# Patient Record
Sex: Male | Born: 1955 | Race: White | Hispanic: No | Smoking: Current every day smoker
Health system: Southern US, Community
[De-identification: ages and names within clinical notes are randomized; demographics above are authoritative.]

---

## 2007-03-10 ENCOUNTER — Ambulatory Visit (HOSPITAL_COMMUNITY): Admission: RE | Admit: 2007-03-10 | Discharge: 2007-03-10 | Payer: Self-pay | Admitting: Family Medicine

## 2007-12-20 ENCOUNTER — Ambulatory Visit (HOSPITAL_COMMUNITY): Admission: RE | Admit: 2007-12-20 | Discharge: 2007-12-20 | Payer: Self-pay | Admitting: Family Medicine

## 2009-04-29 IMAGING — US US EXTREM LOW VENOUS*L*
1 series · 14 of 24 positions shown · non-contrast
Comparison: None available.

CLINICAL DATA: Left leg pain and swelling.



[Series 1: unknown · 14 of 34 slices shown]
[im 1/34]
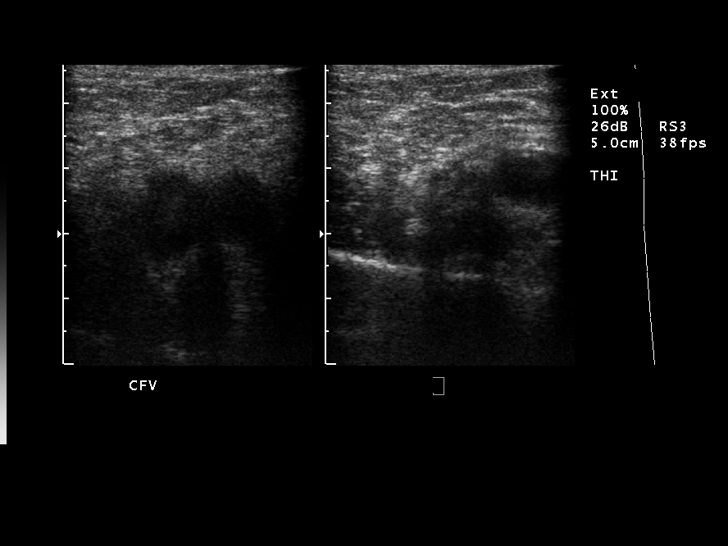
[im 3/34]
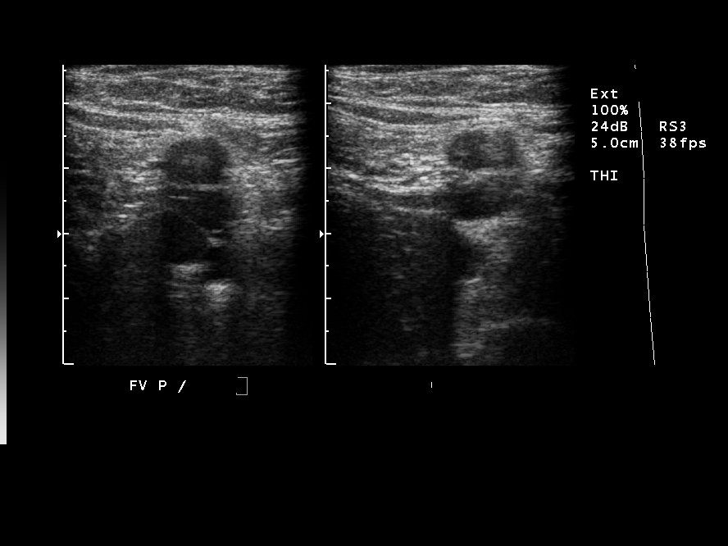
[im 6/34]
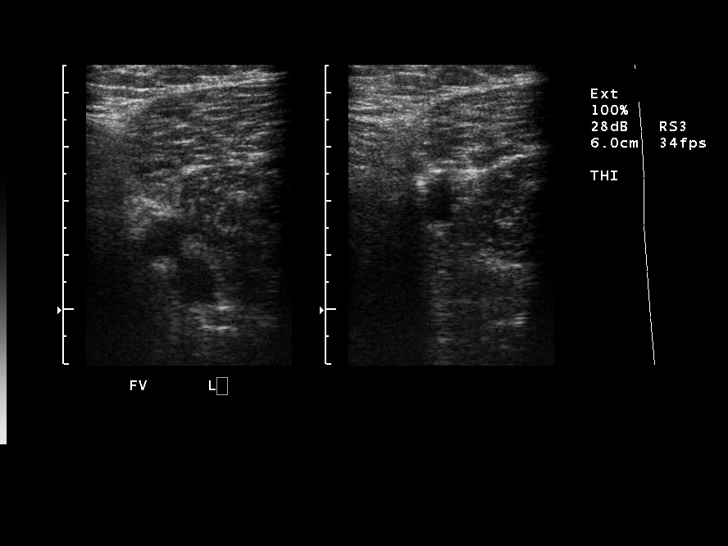
[im 9/34]
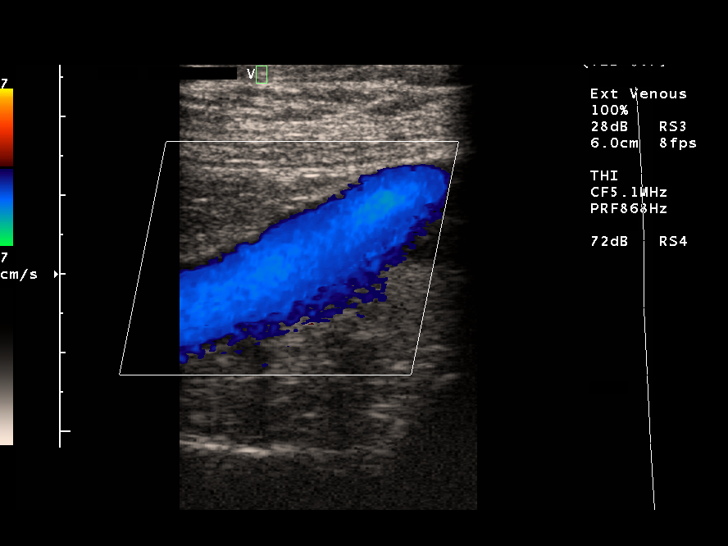
[im 11/34]
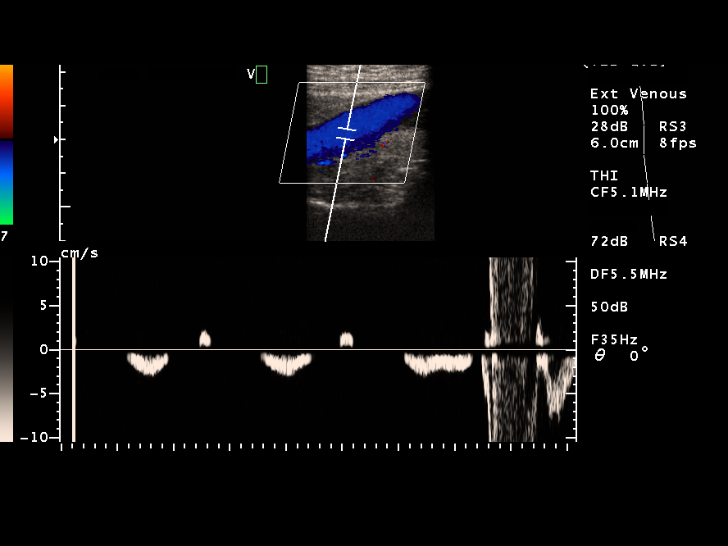
[im 13/34]
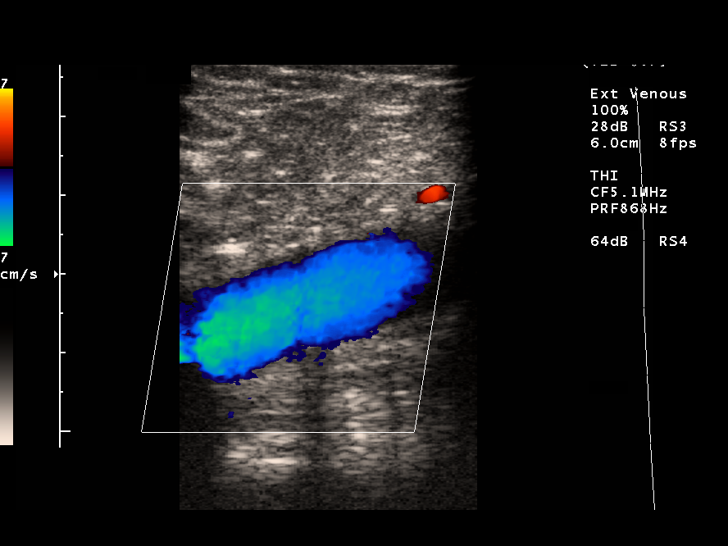
[im 16/34]
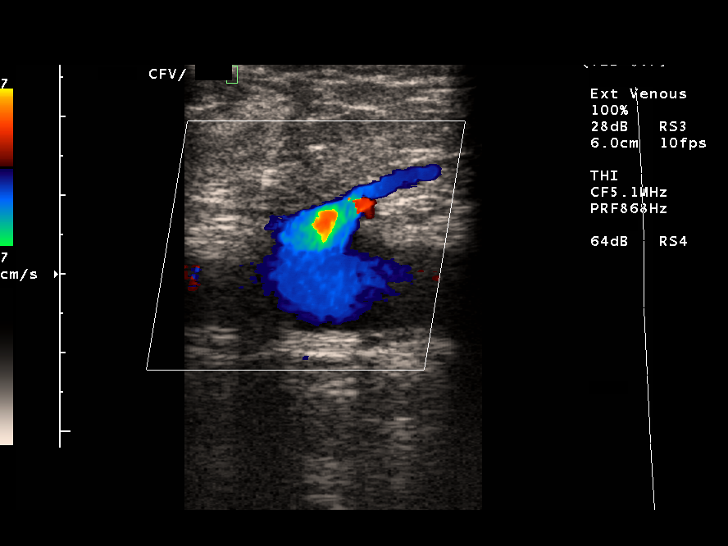
[im 18/34]
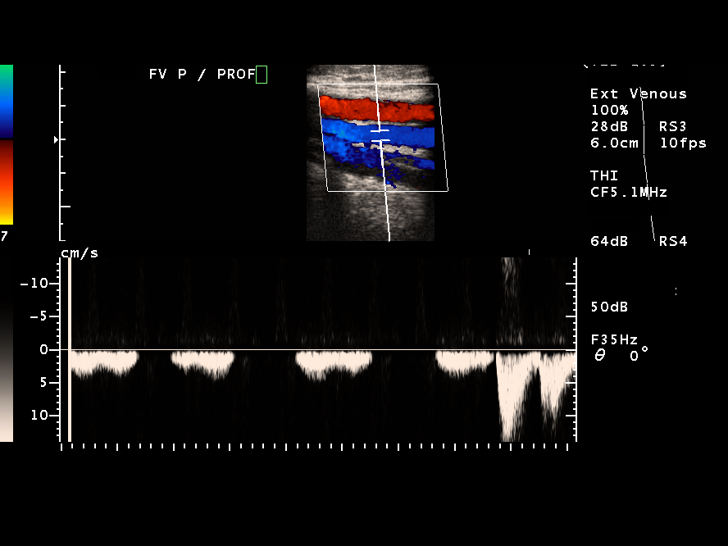
[im 21/34]
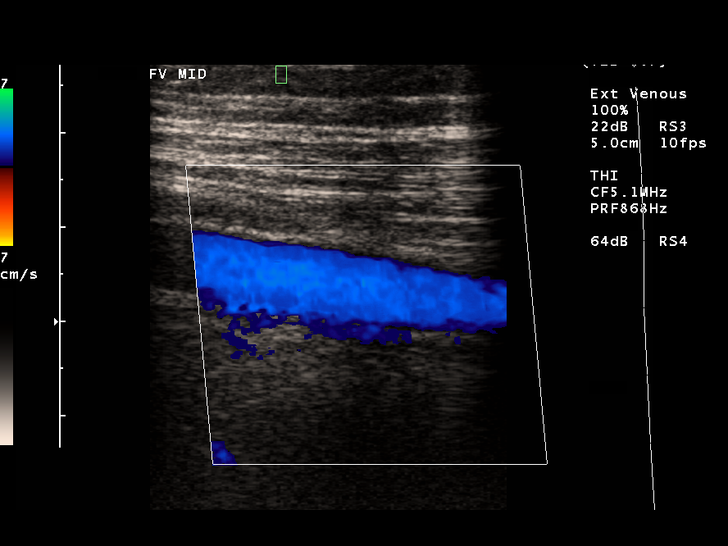
[im 23/34]
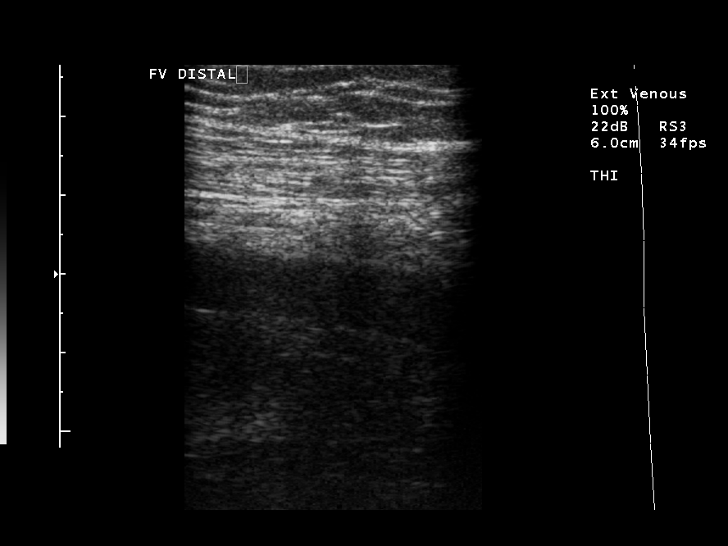
[im 26/34]
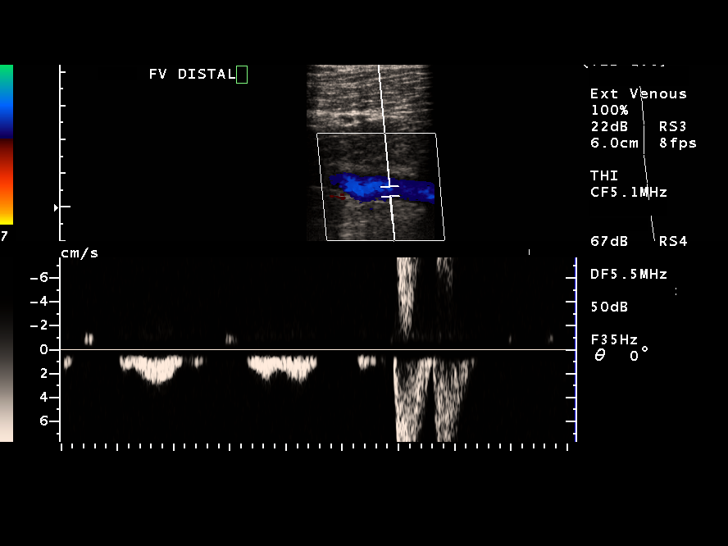
[im 28/34]
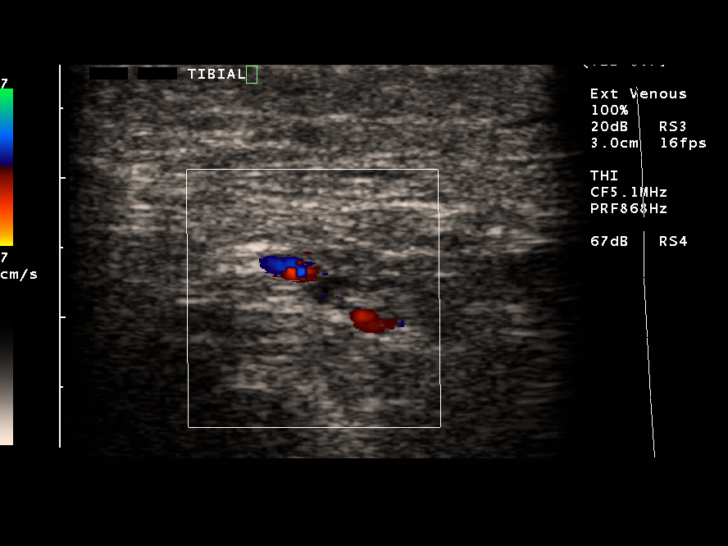
[im 31/34]
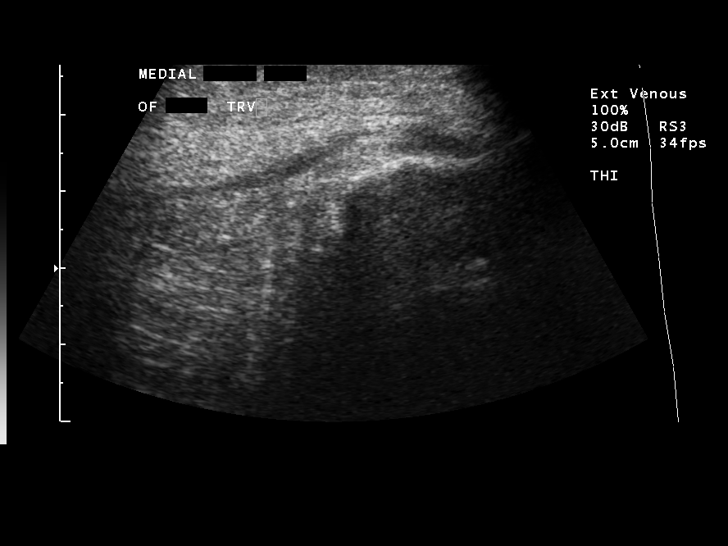
[im 34/34]
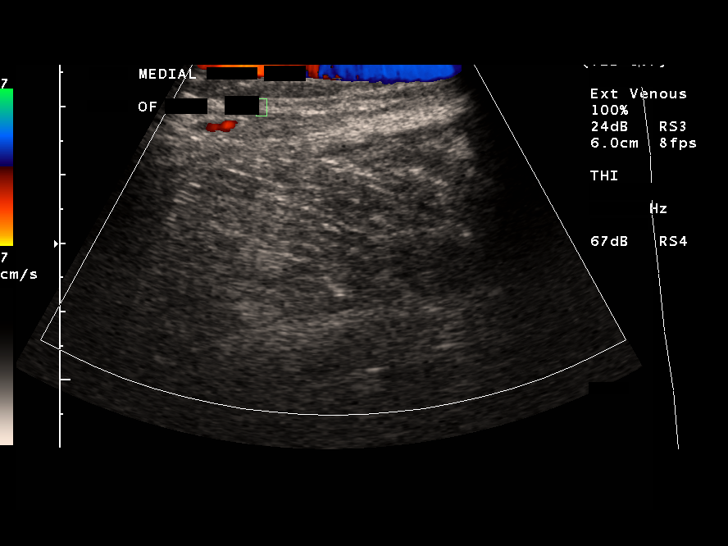

[14 of 24 positions shown; findings below may reference images not displayed]

FINDINGS: There is normal compressibility, phasicity, and
augmentation within the common femoral vein, profundus femoral
vein, and superficial femoral vein.  The popliteal vein is within
normal limits.  There is no evidence for deep venous thrombosis in
the left lower extremity.
IMPRESSION: No evidence for deep venous thrombosis of the left lower extremity.

## 2009-08-06 ENCOUNTER — Inpatient Hospital Stay (HOSPITAL_COMMUNITY): Admission: RE | Admit: 2009-08-06 | Discharge: 2009-08-07 | Payer: Self-pay | Admitting: Urology

## 2009-08-06 ENCOUNTER — Encounter (INDEPENDENT_AMBULATORY_CARE_PROVIDER_SITE_OTHER): Payer: Self-pay | Admitting: Urology

## 2009-08-09 ENCOUNTER — Emergency Department (HOSPITAL_COMMUNITY): Admission: EM | Admit: 2009-08-09 | Discharge: 2009-08-09 | Payer: Self-pay | Admitting: Emergency Medicine

## 2010-04-26 LAB — CBC
HCT: 44.9 % (ref 39.0–52.0)
HCT: 49.3 % (ref 39.0–52.0)
Hemoglobin: 15.7 g/dL (ref 13.0–17.0)
Hemoglobin: 16.6 g/dL (ref 13.0–17.0)
MCH: 33.5 pg (ref 26.0–34.0)
MCHC: 34.9 g/dL (ref 30.0–36.0)
MCHC: 35.2 g/dL (ref 30.0–36.0)
MCV: 95.4 fL (ref 78.0–100.0)
MCV: 96.4 fL (ref 78.0–100.0)
Platelets: 258 10*3/uL (ref 150–400)
RBC: 4.64 MIL/uL (ref 4.22–5.81)
RBC: 5.12 MIL/uL (ref 4.22–5.81)
RDW: 13.5 % (ref 11.5–15.5)
RDW: 13.6 % (ref 11.5–15.5)
WBC: 12.8 10*3/uL — ABNORMAL HIGH (ref 4.0–10.5)

## 2010-04-26 LAB — DIFFERENTIAL
Basophils Absolute: 0 10*3/uL (ref 0.0–0.1)
Lymphs Abs: 0.6 10*3/uL — ABNORMAL LOW (ref 0.7–4.0)
Monocytes Absolute: 0.3 10*3/uL (ref 0.1–1.0)
Monocytes Absolute: 1.1 10*3/uL — ABNORMAL HIGH (ref 0.1–1.0)
Monocytes Relative: 2 % — ABNORMAL LOW (ref 3–12)
Neutro Abs: 10.4 10*3/uL — ABNORMAL HIGH (ref 1.7–7.7)
Neutrophils Relative %: 81 % — ABNORMAL HIGH (ref 43–77)
Neutrophils Relative %: 94 % — ABNORMAL HIGH (ref 43–77)

## 2010-04-26 LAB — URINALYSIS, ROUTINE W REFLEX MICROSCOPIC
Glucose, UA: NEGATIVE mg/dL
Ketones, ur: NEGATIVE mg/dL
Nitrite: NEGATIVE
Specific Gravity, Urine: 1.024 (ref 1.005–1.030)
pH: 5 (ref 5.0–8.0)

## 2010-04-26 LAB — BASIC METABOLIC PANEL
BUN: 10 mg/dL (ref 6–23)
CO2: 24 mEq/L (ref 19–32)
Chloride: 108 mEq/L (ref 96–112)
Creatinine, Ser: 1.11 mg/dL (ref 0.4–1.5)
GFR calc Af Amer: >60 mL/min (ref >60)
GFR calc non Af Amer: >60 mL/min (ref >60)
Glucose, Bld: 114 mg/dL — ABNORMAL HIGH (ref 70–99)
Glucose, Bld: 132 mg/dL — ABNORMAL HIGH (ref 70–99)

## 2010-04-26 LAB — SURGICAL PCR SCREEN
MRSA, PCR: NEGATIVE
Staphylococcus aureus: NEGATIVE

## 2010-04-26 LAB — COMPREHENSIVE METABOLIC PANEL
Albumin: 4.2 g/dL (ref 3.5–5.2)
BUN: 14 mg/dL (ref 6–23)
Chloride: 107 mEq/L (ref 96–112)
GFR calc Af Amer: >60 mL/min (ref >60)
GFR calc non Af Amer: >60 mL/min (ref >60)
Glucose, Bld: 125 mg/dL — ABNORMAL HIGH (ref 70–99)
Potassium: 3.6 mEq/L (ref 3.5–5.1)
Total Bilirubin: 0.8 mg/dL (ref 0.3–1.2)
Total Protein: 7.1 g/dL (ref 6.0–8.3)

## 2010-04-26 LAB — URINE MICROSCOPIC-ADD ON

## 2010-04-26 LAB — TYPE AND SCREEN

## 2010-12-08 IMAGING — CR DG CHEST 2V
2 series · 2 of 2 positions shown · non-contrast
Comparison: Chest x-ray of 03/10/2007

CLINICAL DATA: Preop for prostate carcinoma

CHEST - 2 VIEW

[w chest pa]
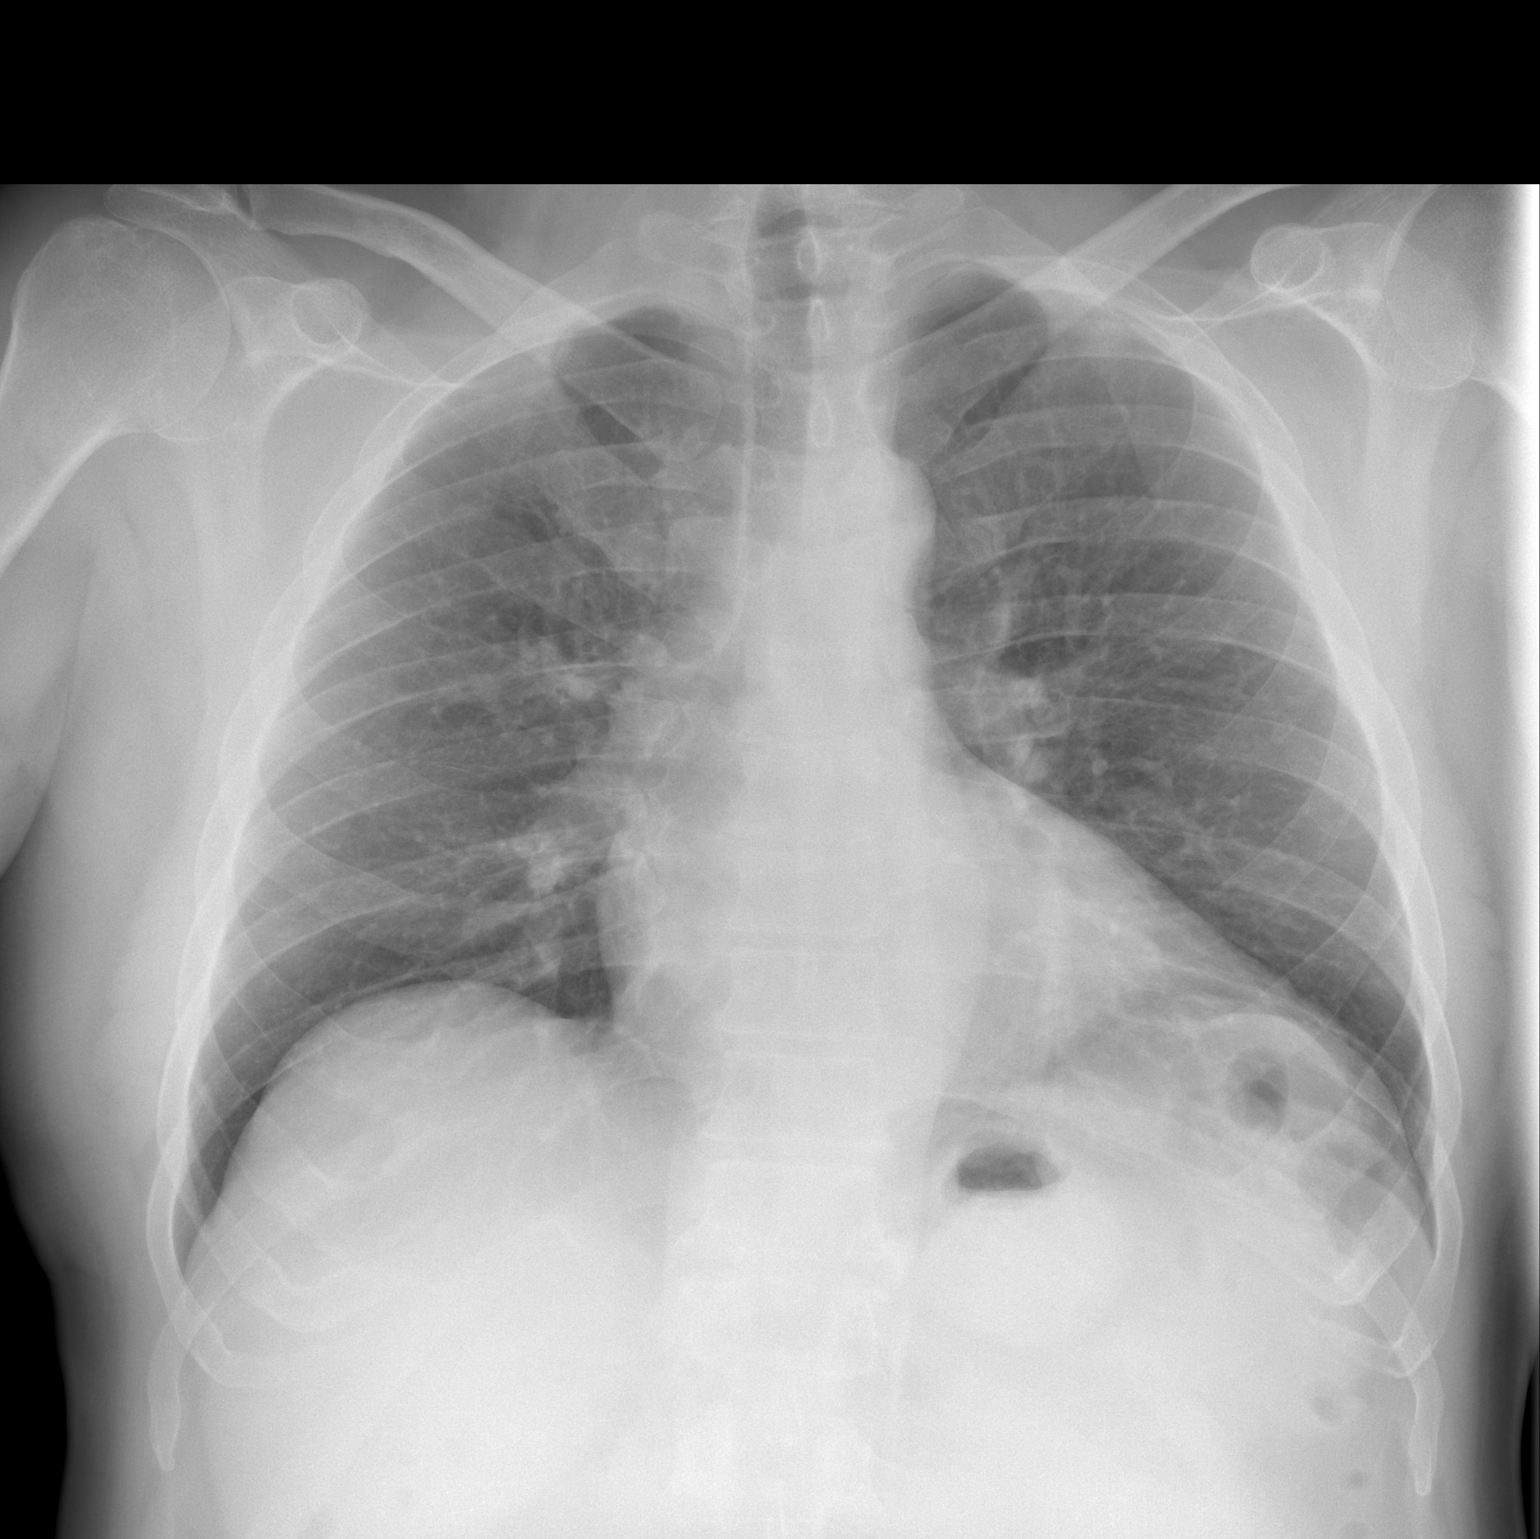

[w chest lat]
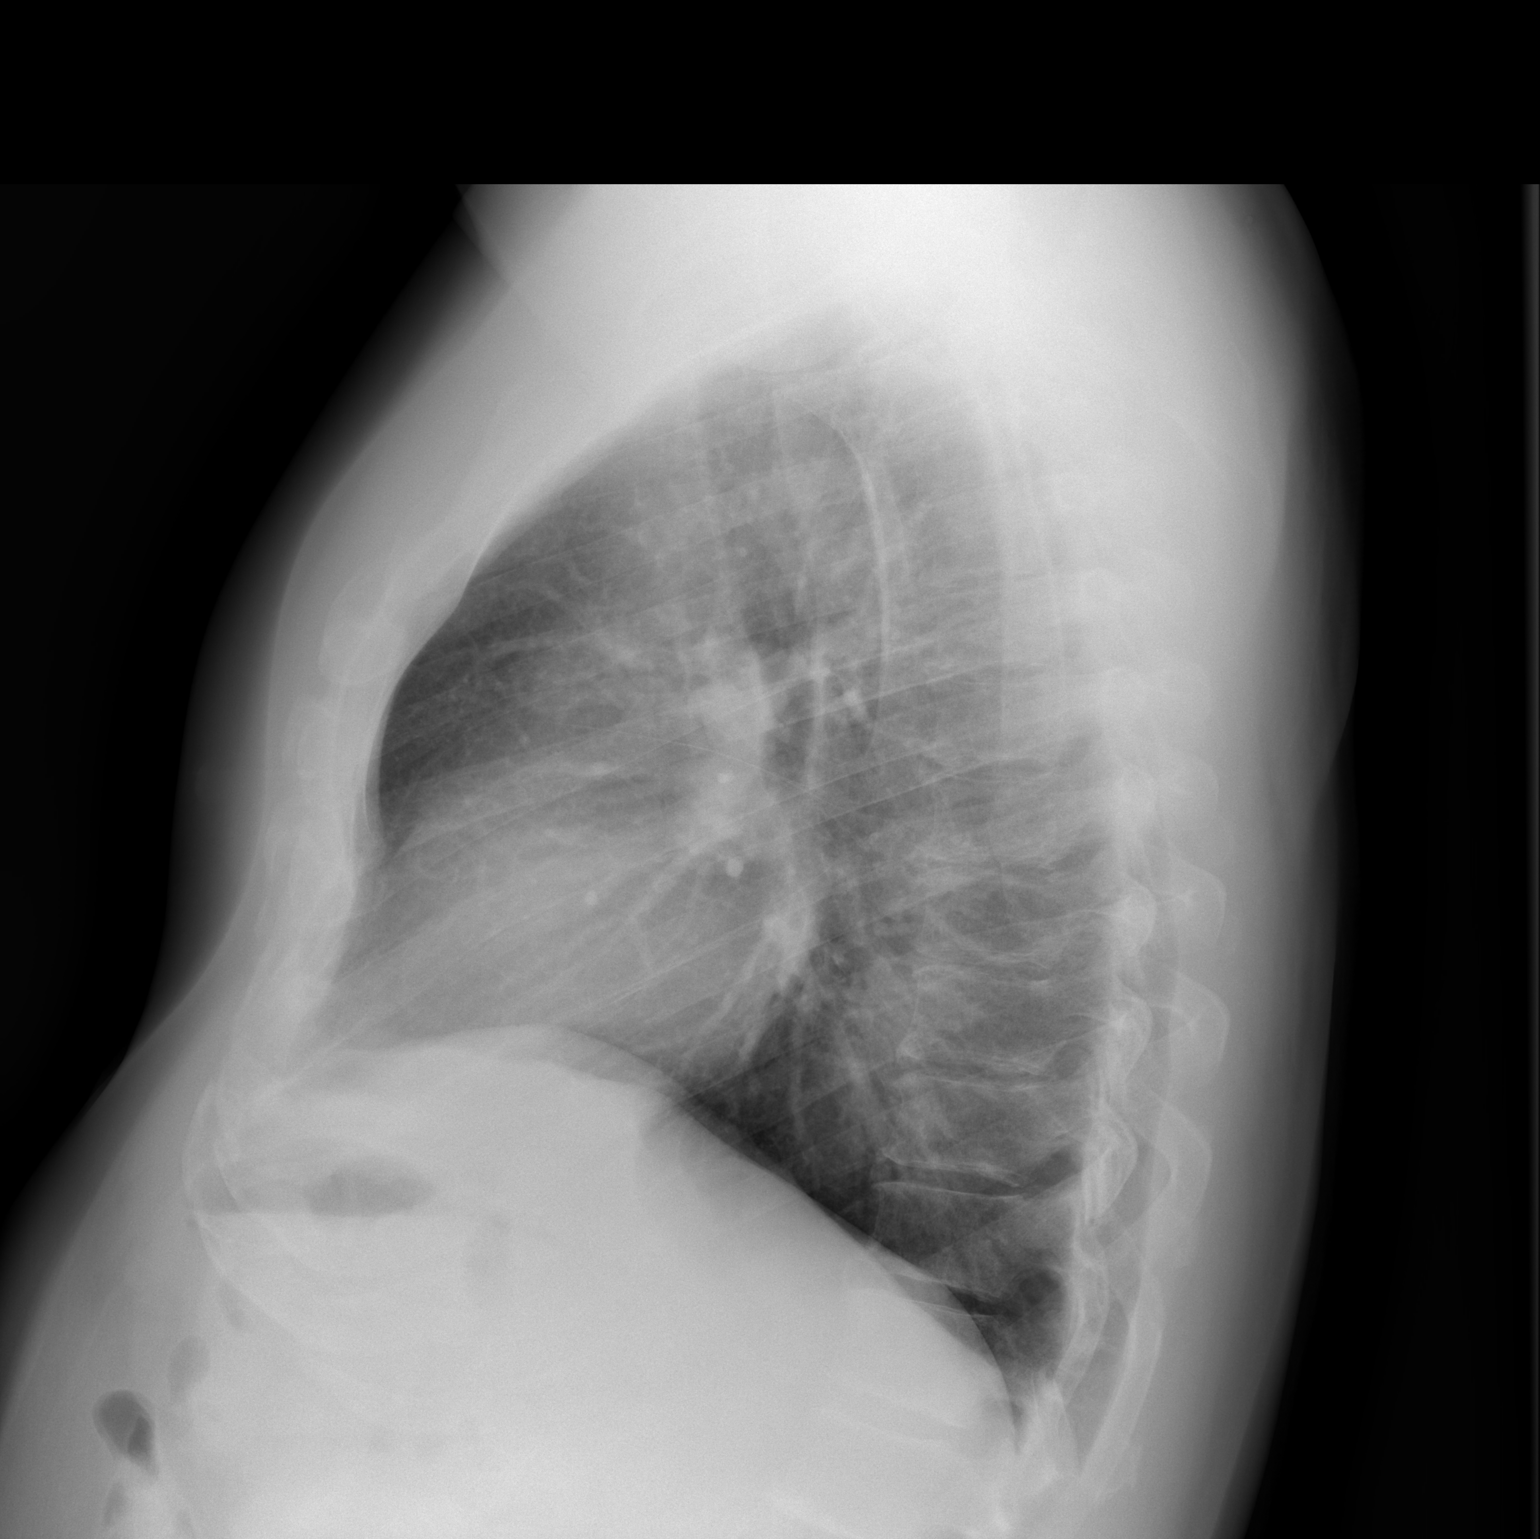

[2 of 2 positions shown; findings below may reference images not displayed]

FINDINGS: The lungs are clear.  The heart is within upper limits of
normal.  No acute bony abnormality is seen.
IMPRESSION: No active lung disease.

## 2012-03-29 ENCOUNTER — Other Ambulatory Visit: Payer: Self-pay | Admitting: Dermatology

## 2013-09-19 ENCOUNTER — Other Ambulatory Visit: Payer: Self-pay | Admitting: Dermatology

## 2013-10-10 ENCOUNTER — Other Ambulatory Visit: Payer: Self-pay | Admitting: Dermatology

## 2020-06-30 ENCOUNTER — Ambulatory Visit (INDEPENDENT_AMBULATORY_CARE_PROVIDER_SITE_OTHER): Payer: Medicare Other | Admitting: Dermatology

## 2020-06-30 ENCOUNTER — Encounter: Payer: Self-pay | Admitting: Dermatology

## 2020-06-30 ENCOUNTER — Other Ambulatory Visit: Payer: Self-pay

## 2020-06-30 DIAGNOSIS — Z1283 Encounter for screening for malignant neoplasm of skin: Secondary | ICD-10-CM

## 2020-06-30 DIAGNOSIS — C84A0 Cutaneous T-cell lymphoma, unspecified, unspecified site: Secondary | ICD-10-CM | POA: Diagnosis not present

## 2020-06-30 DIAGNOSIS — L570 Actinic keratosis: Secondary | ICD-10-CM

## 2020-06-30 DIAGNOSIS — C4361 Malignant melanoma of right upper limb, including shoulder: Secondary | ICD-10-CM | POA: Diagnosis not present

## 2020-06-30 DIAGNOSIS — D485 Neoplasm of uncertain behavior of skin: Secondary | ICD-10-CM

## 2020-06-30 DIAGNOSIS — C44519 Basal cell carcinoma of skin of other part of trunk: Secondary | ICD-10-CM

## 2020-06-30 NOTE — Patient Instructions (Signed)

## 2020-07-04 ENCOUNTER — Telehealth (INDEPENDENT_AMBULATORY_CARE_PROVIDER_SITE_OTHER): Payer: Medicare Other | Admitting: Dermatology

## 2020-07-04 NOTE — Telephone Encounter (Signed)
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  He was told he will minimally need a wide deep excision with possible grafting of the primary site and that the melanoma surgeon would discuss with him at the initial consultation visit whether they will be doing a sentinel lymph node biopsy at the same time.  He expressed a preference to not use [place] [facility], so I will have Verdell see the melanoma surgeon at [facility] as soon as this can be arranged.  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  He knows that I will work with him to find an alternative treatment for his cutaneous lymphoma, but the melanoma clearly has priority.  He has my cell number and knows that he can call me 24/7 with any questions.

## 2020-07-09 ENCOUNTER — Telehealth: Payer: Self-pay | Admitting: *Deleted

## 2020-07-09 NOTE — Telephone Encounter (Signed)
-----   Message from Lavonna Monarch, MD sent at 07/04/2020  8:01 PM EDT ----- Could be scheduled with one of the melanoma surgeons at [facility] as soon as possible.

## 2020-07-09 NOTE — Telephone Encounter (Signed)
-----   Message from Lavonna Monarch, MD sent at 07/04/2020  8:01 PM EDT ----- Could be scheduled with one of the melanoma surgeons at [facility] as soon as possible.

## 2020-07-09 NOTE — Telephone Encounter (Signed)
[place]- and they said to fax referral over to Aletta Edouard at [phone] for MOHs/treatement of melanoma.  I also requested that they call me once faxed is received and MOHS appointment scheduled.

## 2020-07-09 NOTE — Telephone Encounter (Signed)
-----   Message from Lavonna Monarch, MD sent at 07/04/2020  8:01 PM EDT ----- Could be scheduled with one of the melanoma surgeons at [facility] as soon as possible.

## 2020-07-12 ENCOUNTER — Encounter: Payer: Self-pay | Admitting: Dermatology

## 2020-07-12 NOTE — Progress Notes (Signed)
New Patient   Subjective  Chad Anderson is a 65 y.o. male who presents for the following: Annual Exam (CTCL follow up. Concerns right wrist. ).  CTCL, general skin check, enlarging lesion on right wrist. Location:  Duration:  Quality:  Associated Signs/Symptoms: Modifying Factors:  Severity:  Timing: Context:    The following portions of the chart were reviewed this encounter and updated as appropriate:      Objective  Well appearing patient in no apparent distress; mood and affect are within normal limits. Objective  Left Abdomen (side) - Upper: Extensive patch type cutaneous lymphoma.  No nodules or ulceration.  No adenopathy.  Roughly 60% body surface area involved.  No obvious mucosal involvement.           Objective  Right Upper Back: Pearly 1.2 cm slightly raised nodule typical of BCC.     Objective  Left Upper Arm - Posterior: Patch of erythema with fine scale     Objective  Right Wrist - Posterior: 1.8cm purpleblack nodule.  No regional adenopathy.     Objective  Mid Back: General skin examination done.    A full examination was performed including scalp, head, eyes, ears, nose, lips, neck, chest, axillae, abdomen, back, buttocks, bilateral upper extremities, bilateral lower extremities, hands, feet, fingers, toes, fingernails, and toenails. All findings within normal limits unless otherwise noted below.  And lymph nodes.   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  He had fairly extensive PUVA therapy 15 years ago, and if lesion on the right wrist proves to be a melanoma this would be a significant contraindication.  He is a potential candidate for either bexarotene or low-dose methotrexate, but I  do think the right wrist lesion has first priority.  I did tell Chad Anderson that input from a medical oncologist will almost certainly be essential.  Neoplasm of uncertain behavior of skin (3) Right Upper Back  Skin / nail biopsy Type of biopsy: tangential   Informed consent: discussed and consent obtained   Timeout: patient name, date of birth, surgical site, and procedure verified   Anesthesia: the lesion was anesthetized in a standard fashion   Anesthetic:  1% lidocaine w/ epinephrine 1-100,000 local infiltration Instrument used: flexible razor blade   Hemostasis achieved with: ferric subsulfate   Outcome: patient tolerated procedure well   Post-procedure details: wound care instructions given    Specimen 1 - Surgical pathology Differential Diagnosis: scc vs bcc  Check Margins: No  Left Upper Arm - Posterior  Skin / nail biopsy Type of biopsy: tangential   Informed consent: discussed and consent obtained   Timeout: patient name, date of birth, surgical site, and procedure verified   Anesthesia: the lesion was anesthetized in a standard fashion   Anesthetic:  1% lidocaine w/ epinephrine 1-100,000 local infiltration Instrument used: flexible razor blade   Hemostasis achieved with: ferric subsulfate   Outcome: patient tolerated procedure well   Post-procedure details: wound care instructions given    Specimen 2 - Surgical pathology Differential Diagnosis: scc vs bcc  Check Margins: No  Right Wrist - Posterior  Skin / nail biopsy Type of biopsy: tangential   Informed consent: discussed and consent obtained   Timeout: patient name, date of birth,  surgical site, and procedure verified   Anesthesia: the lesion was anesthetized in a standard fashion   Anesthetic:  1% lidocaine w/ epinephrine 1-100,000 local infiltration Instrument used: flexible razor blade   Hemostasis achieved with: ferric subsulfate   Outcome: patient tolerated procedure well   Post-procedure details:  wound care instructions given    Specimen 3 - Surgical pathology Differential Diagnosis: atypia, melanoma  Check Margins: No  Encounter for screening for malignant neoplasm of skin Mid Back  Follow-up will be determined by results of biopsies.
